# Patient Record
Sex: Female | Born: 1955 | Race: Black or African American | Hispanic: No | Smoking: Never smoker
Health system: Southern US, Community
[De-identification: ages and names within clinical notes are randomized; demographics above are authoritative.]

## PROBLEM LIST (undated history)

## (undated) DIAGNOSIS — I10 Essential (primary) hypertension: Secondary | ICD-10-CM

## (undated) DIAGNOSIS — M1990 Unspecified osteoarthritis, unspecified site: Secondary | ICD-10-CM

## (undated) DIAGNOSIS — K219 Gastro-esophageal reflux disease without esophagitis: Secondary | ICD-10-CM

## (undated) DIAGNOSIS — I219 Acute myocardial infarction, unspecified: Secondary | ICD-10-CM

## (undated) HISTORY — PX: TUBAL LIGATION: SHX77

## (undated) HISTORY — DX: Acute myocardial infarction, unspecified: I21.9

## (undated) HISTORY — DX: Unspecified osteoarthritis, unspecified site: M19.90

---

## 2005-06-17 ENCOUNTER — Emergency Department (HOSPITAL_COMMUNITY): Admission: EM | Admit: 2005-06-17 | Discharge: 2005-06-17 | Payer: Self-pay | Admitting: Emergency Medicine

## 2008-10-23 ENCOUNTER — Inpatient Hospital Stay (HOSPITAL_COMMUNITY): Admission: EM | Admit: 2008-10-23 | Discharge: 2008-10-24 | Payer: Self-pay | Admitting: Emergency Medicine

## 2008-10-23 ENCOUNTER — Ambulatory Visit: Payer: Self-pay | Admitting: Internal Medicine

## 2011-04-20 NOTE — Discharge Summary (Signed)
NAMEANALYSSE, QUINONEZ NO.:  [id number]   MEDICAL RECORD NO.:  [id number]          PATIENT TYPE:  INP   LOCATION:  3707                         FACILITY:  [facility]   PHYSICIAN:  Alvester Morin, M.D.  DATE OF BIRTH:  08/10/1956   DATE OF ADMISSION:  10/23/2008  DATE OF DISCHARGE:  10/24/2008                               DISCHARGE SUMMARY   CONTINUITY DOCTOR:  Celene Kras, MD   DISCHARGE DIAGNOSES:  Chest pain and arm pain likely musculoskeletal  pain.   OTHER DIAGNOSES:  1. History of hypertension, not on medication.  2. Gastroesophageal reflux disease.   DISCHARGE MEDICATIONS:  1. Protonix 40 mg p.o. daily.  2. Tylenol 325 mg q.8 h. for pain as needed.   DISPOSITION AND FOLLOWUP:  The patient has an appointment with Dr.  Stevphen Rochester.  She will receive a call from Dr. Kerry Kass office for  appointment schedule, and at that appointment, resolution of her arm  pain and chest pain need to be followed up.  Also her blood pressure  need to be followed up to see if the patient will require any  antihypertensive medication.   PROCEDURES:  None.   CONSULTANTS:  None.   HISTORY OF PRESENT ILLNESS:  A 55 year old with past medical history  significant for hypertension, not on medications and GERD, who presents  to the Emergency Department complaining of chest pain.  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  She described her pain at 6/10, lasted 2 minutes, stabbing  in quality, not associated with dizziness, diaphoresis, nausea, and not  pleuritic in nature.  She also related left arm pain that has started 3  days prior to admission, same quality and stabbing in nature at 8/10 and  intermittent.  She denies any history of trauma.  The pain is not worse  by movement.  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  She also denies nausea, vomiting, diarrhea, or reflux symptoms.   VITAL SIGNS:   Temperature 97.9, blood pressure 166/83, pulse 99,  respirations 20, sat 100% on room air.  GENERAL:  The patient was lying in bed in not acute distress.  EYES:  PERRLA.  Extraocular muscles intact.  ENT:  No tonsillar enlargement.  No plaque.  No erythema.  NECK:  Supple.  No thyromegaly.  RESPIRATION:  Clear to auscultation.  No wheezes.  No rales.  No  rhonchi.  CARDIOVASCULAR:  S1 and S2, normal.  Regular rhythm and rate.  No  murmur.  SKIN:  No rashes.  NEUROLOGIC:  Nonfocal.   ADMISSION LABORATORY DATA:  Sodium 142, potassium 3.7, chloride 108,  bicarb 24, BUN 15, creatinine 0.8, glucose 113.  CBC; white blood cells  7.2, hemoglobin 15.9, hematocrit 41.0, platelet 287, ANC 4.2, MCV 86.7.  EKG sinus tachy, no ST-T wave abnormalities.  Chest x-ray, no acute  cardiopulmonary process.  Troponin, point-of-care marker 0.05.   HOSPITAL COURSE:  Problem #1:  Chest pain.  This was likely secondary to  musculoskeletal pain.  The patient was  admitted to the telemetry floor  to rule out acute coronary syndrome.  EKG, she had some T-wave inversion  in V1, V2, V3 and these remained the same on repeated EKG.  Cardiac  enzymes x3, negative.  Chest x-ray was negative for infiltrate or  widening of the mediastinum or pneumothorax.  UDS was negative.  TSH was  normal at 0.875.  Lipid profile with cholesterol at 127, HDL 34, LDL 73.  Hemoglobin A1c 5.7.  During the admission, the patient's pain resolved  after rest.   Problem #2:  Hypertension.  The patient was found to have a blood  pressure, during admission of 166/83.  She was started on metoprolol and  her blood pressure decreased to 110/57, 120/78.  We decided to discharge  her on no blood pressure medications.  She will need to have a follow  with her primary care physician and to measure her blood pressure again  to see if the patient require any antihypertensive medication.  For her  blood pressure, we will recommend a diuretic like  hydrochlorothiazide or  Lasix.   Problem #3:  GERD.  We will continue with Protonix during this  admission.   Ms. Rollo was discharged in good condition.  Her chest pain and arm  pain has resolved.  Vital signs, respirations 18, blood pressure 120/78,  sat 100% on room air.      Hartley Barefoot, MD  Electronically Signed      Alvester Morin, M.D.  Electronically Signed    BR/MEDQ  D:  10/24/2008  T:  10/25/2008  Job:  [id number]

## 2011-09-08 LAB — POCT I-STAT, CHEM 8
Calcium, Ion: 1.21
Chloride: 108
Creatinine, Ser: 0.8
Glucose, Bld: 113 — ABNORMAL HIGH
Potassium: 3.7
Sodium: 142

## 2011-09-08 LAB — CBC
Hemoglobin: 11.5 — ABNORMAL LOW
Hemoglobin: 13.5
MCHC: 33.8
MCHC: 34.5
MCV: 86.7
Platelets: 239
RDW: 13.5
WBC: 7.2

## 2011-09-08 LAB — BASIC METABOLIC PANEL
BUN: 8
Chloride: 111
Creatinine, Ser: 0.68
GFR calc non Af Amer: >60
Sodium: 141

## 2011-09-08 LAB — DIFFERENTIAL
Basophils Relative: 1
Eosinophils Absolute: 0
Eosinophils Relative: 0
Lymphocytes Relative: 33
Lymphs Abs: 2.4
Monocytes Absolute: 0.4
Neutrophils Relative %: 46
Neutrophils Relative %: 59

## 2011-09-08 LAB — COMPREHENSIVE METABOLIC PANEL
Albumin: 3.6
Alkaline Phosphatase: 75
CO2: 24
Calcium: 8.9
GFR calc Af Amer: >60
Total Bilirubin: 0.9

## 2011-09-08 LAB — PROTIME-INR: INR: 0.9

## 2011-09-08 LAB — CARDIAC PANEL(CRET KIN+CKTOT+MB+TROPI)
CK, MB: 0.8
Relative Index: INVALID
Total CK: 89
Total CK: 93
Troponin I: <0.01

## 2011-09-08 LAB — CK TOTAL AND CKMB (NOT AT ARMC): Relative Index: 0.8

## 2011-09-08 LAB — LIPID PANEL
Cholesterol: 143
HDL: 34 — ABNORMAL LOW
LDL Cholesterol: 73
LDL Cholesterol: 87
Total CHOL/HDL Ratio: 3.7
Triglycerides: 100
Triglycerides: 55

## 2011-09-08 LAB — RAPID URINE DRUG SCREEN, HOSP PERFORMED
Amphetamines: NOT DETECTED
Barbiturates: NOT DETECTED
Benzodiazepines: NOT DETECTED
Cocaine: NOT DETECTED
Opiates: NOT DETECTED
Tetrahydrocannabinol: NOT DETECTED

## 2011-09-08 LAB — APTT: aPTT: 31

## 2011-09-08 LAB — TSH: TSH: 0.875

## 2011-09-08 LAB — POCT CARDIAC MARKERS
CKMB, poc: <1 — ABNORMAL LOW
Troponin i, poc: <0.05

## 2011-09-08 LAB — TROPONIN I: Troponin I: <0.01

## 2011-09-08 LAB — HEMOGLOBIN A1C: Hgb A1c MFr Bld: 5.7

## 2013-04-10 ENCOUNTER — Encounter (HOSPITAL_COMMUNITY): Payer: Self-pay | Admitting: Emergency Medicine

## 2013-04-10 ENCOUNTER — Emergency Department (HOSPITAL_COMMUNITY): Payer: Self-pay

## 2013-04-10 ENCOUNTER — Emergency Department (HOSPITAL_COMMUNITY)
Admission: EM | Admit: 2013-04-10 | Discharge: 2013-04-10 | Disposition: A | Discharge status: Home / Self Care | Payer: Self-pay | Attending: Emergency Medicine | Admitting: Emergency Medicine

## 2013-04-10 DIAGNOSIS — R0789 Other chest pain: Secondary | ICD-10-CM | POA: Insufficient documentation

## 2013-04-10 DIAGNOSIS — R062 Wheezing: Secondary | ICD-10-CM | POA: Insufficient documentation

## 2013-04-10 DIAGNOSIS — Z7982 Long term (current) use of aspirin: Secondary | ICD-10-CM | POA: Insufficient documentation

## 2013-04-10 DIAGNOSIS — E049 Nontoxic goiter, unspecified: Secondary | ICD-10-CM | POA: Insufficient documentation

## 2013-04-10 DIAGNOSIS — E876 Hypokalemia: Secondary | ICD-10-CM

## 2013-04-10 DIAGNOSIS — J159 Unspecified bacterial pneumonia: Secondary | ICD-10-CM | POA: Insufficient documentation

## 2013-04-10 DIAGNOSIS — Z8719 Personal history of other diseases of the digestive system: Secondary | ICD-10-CM | POA: Insufficient documentation

## 2013-04-10 DIAGNOSIS — J189 Pneumonia, unspecified organism: Secondary | ICD-10-CM

## 2013-04-10 DIAGNOSIS — I10 Essential (primary) hypertension: Secondary | ICD-10-CM | POA: Insufficient documentation

## 2013-04-10 DIAGNOSIS — R071 Chest pain on breathing: Secondary | ICD-10-CM | POA: Insufficient documentation

## 2013-04-10 HISTORY — DX: Gastro-esophageal reflux disease without esophagitis: K21.9

## 2013-04-10 HISTORY — DX: Essential (primary) hypertension: I10

## 2013-04-10 LAB — BASIC METABOLIC PANEL
CO2: 27 mEq/L (ref 19–32)
Calcium: 9.3 mg/dL (ref 8.4–10.5)
Glucose, Bld: 150 mg/dL — ABNORMAL HIGH (ref 70–99)
Potassium: 3 mEq/L — ABNORMAL LOW (ref 3.5–5.1)
Sodium: 138 mEq/L (ref 135–145)

## 2013-04-10 LAB — CBC
HCT: 34 % — ABNORMAL LOW (ref 36.0–46.0)
Hemoglobin: 12.1 g/dL (ref 12.0–15.0)
MCH: 28.7 pg (ref 26.0–34.0)
MCV: 80.8 fL (ref 78.0–100.0)
Platelets: 474 10*3/uL — ABNORMAL HIGH (ref 150–400)
RDW: 13.1 % (ref 11.5–15.5)

## 2013-04-10 LAB — POCT I-STAT TROPONIN I

## 2013-04-10 MED ORDER — HYDROCOD POLST-CHLORPHEN POLST 10-8 MG/5ML PO LQCR: 5.0000 mL | Freq: Two times a day (BID) | ORAL | Status: DC | PRN

## 2013-04-10 MED ORDER — HYDROCODONE-ACETAMINOPHEN 5-325 MG PO TABS
1.0000 | ORAL_TABLET | Freq: Once | ORAL | Status: AC
  Administered 2013-04-10: 1 via ORAL
  Filled 2013-04-10: qty 1

## 2013-04-10 MED ORDER — POTASSIUM CHLORIDE CRYS ER 20 MEQ PO TBCR
40.0000 meq | EXTENDED_RELEASE_TABLET | Freq: Two times a day (BID) | ORAL | Status: DC
  Administered 2013-04-10: 40 meq via ORAL
  Filled 2013-04-10: qty 2

## 2013-04-10 MED ORDER — ASPIRIN 81 MG PO TBEC: 81.0000 mg | DELAYED_RELEASE_TABLET | Freq: Every day | ORAL | Status: AC

## 2013-04-10 MED ORDER — AZITHROMYCIN 250 MG PO TABS: ORAL_TABLET | ORAL | Status: DC

## 2013-04-10 NOTE — ED Notes (Signed)
Pt c/o cough x 2 weeks and CP in left axillary and chest area worse with cough starting today

## 2013-04-10 NOTE — ED Notes (Signed)
Pt alert and mentating appropriately upon d/c. Pt given d/c teaching and prescriptions. Pt verbalizes understanding and has no further questions upon d/c. Pt instructed not to drive. Family at bedside endorses pt will not be driving home and they will be taking pt home. NAD noted upon d/c.

## 2013-04-10 NOTE — ED Provider Notes (Signed)
History     CSN: [id number]  Arrival date & time 04/10/13  1648   None     Chief Complaint  Patient presents with  . Cough  . Chest Pain    HPI Eileen Friedman is a 57 year old woman with PMH HTN, GERD, CAD (no cath or carotid artery disease evaluation) who presents with cough x 2 weeks and left chest pain with cough that started today. Her cough was initially productive of green sputum but gradually became unproductive. She has taken Robitussin and Mucinex with no relief of her cough. Sometimes she has coughing spells. She had subjective fever and chills two weeks ago but none recently. She had non bloody, non bilious emesis x2 yesterday, her appetite is decreased.  Her chest pain was initially present on the right side but has now moved to her left side, just below her axilla. She describes the pain as constant, dull ache pain that is worse with arm movement.   No recent travel/immobilization, no history of DVT/PE.   She denies shortness of breath, headache, blurry vision, diaphoresis, abdominal pain, constipation, diarrhea, or dysuria.    Past Medical History  Diagnosis Date  . GERD (gastroesophageal reflux disease)   . HTN (hypertension)     History reviewed. No pertinent past surgical history.  History reviewed. No pertinent family history.  History  Substance Use Topics  . Smoking status: Never Smoker   . Smokeless tobacco: Not on file  . Alcohol Use: Yes     Comment: occ    OB History   Grav Para Term Preterm Abortions TAB SAB Ect Mult Living                  Review of Systems  Constitutional: Positive for appetite change. Negative for fever, chills, diaphoresis, activity change, fatigue and unexpected weight change.  HENT: Negative for congestion.   Respiratory: Positive for cough, chest tightness and wheezing. Negative for shortness of breath.   Cardiovascular: Positive for chest pain. Negative for palpitations and leg swelling.  Gastrointestinal: Negative for  abdominal pain.  Genitourinary: Negative for dysuria.  Musculoskeletal: Negative for back pain.  Skin: Negative for rash.  Neurological: Negative for dizziness, weakness, light-headedness and headaches.  Psychiatric/Behavioral: Negative for agitation.    Allergies  Review of patient's allergies indicates no known allergies.  Home Medications   Current Outpatient Rx  Name  Route  Sig  Dispense  Refill  . aspirin EC 81 MG tablet   Oral   Take 81 mg by mouth daily.           BP 176/83  Pulse 98  Temp(Src) 98.4 F (36.9 C) (Oral)  Resp 18  SpO2 100%  Physical Exam  Nursing note and vitals reviewed. Constitutional: She is oriented to person, place, and time.  Obese  HENT:  Head: Atraumatic.  Mild edema of nasal turbinates  Eyes: Conjunctivae are normal.  Neck: Thyromegaly present.  Cardiovascular: Regular rhythm.   No murmur heard. tachycardia  Pulmonary/Chest: Effort normal and breath sounds normal. No respiratory distress. She has no wheezes. She has no rales. She exhibits no tenderness.  Expiratory rhonchi on the right  Abdominal: Soft. Bowel sounds are normal. She exhibits no distension. There is no tenderness. There is no rebound and no guarding.  Musculoskeletal: Normal range of motion. She exhibits tenderness. She exhibits no edema.  Tenderness to palpation left chest under axilla, no rib deformity  Neurological: She is alert and oriented to person, place, and  time.  Skin: Skin is warm and dry. No rash noted. No erythema. No pallor.  Psychiatric: She has a normal mood and affect. Her behavior is normal.    ED Course  Procedures (including critical care time)  Labs Reviewed  CBC - Abnormal; Notable for the following:    HCT 34.0 (*)    Platelets 474 (*)    All other components within normal limits  BASIC METABOLIC PANEL - Abnormal; Notable for the following:    Potassium 3.0 (*)    Glucose, Bld 150 (*)    All other components within normal limits  POCT  I-STAT TROPONIN I   Dg Chest 2 View  04/10/2013  *RADIOLOGY REPORT*  Clinical Data: Cough  CHEST - 2 VIEW  Comparison: 10/24/2008  Findings: Mild cardiomegaly.  Vascular clips in the right upper abdomen.  Coarse bibasilar interstitial opacities,   more conspicuous than on previous exam.  No effusion.  Minimal spurring in the mid thoracic spine.  IMPRESSION:  1.  Mild cardiomegaly with some increase in bibasilar interstitial opacities since previous exam.   Original Report Authenticated By: D. Andria Rhein, MD      No diagnosis found.   Date: 04/10/2013  Rate: 101  Rhythm: normal sinus rhythm  QRS Axis: normal  Intervals: normal, QTc <460  ST/T Wave abnormalities: nonspecific T wave changes  Conduction Disutrbances:none  Narrative Interpretation: nonspecific T wave flatening  Old EKG Reviewed: unchanged    MDM  57 year old woman with PMH of GERD, HTN presenting with cough x2 weeks and left sided chest pain x 1 day. EKG with no changes, troponin x1 negative. TIMI score of 1, low risk for MI. Wells score of 1.5, low risk for PE, no SOB. CXR with coarse bibasilar interstitial opacities, mild cardiomegaly, no edema on physical exam. CURB65 of ZERO.  Chest pain: most likely MSK, Vicodin once.   Cough/Possible community acquired PNA: Tusionex PRN, Zpack.  HTN: Pt does not have a PCP, therefore will not prescribe HCTZ/ACEi due to possible loss to follow up. Pt will follow up with [facility] for BP recheck.   Hypokalemia: Likely 2/2 decreased appetite, and emesis x2 yesterday. Kdur once. Patient advised to return to ED if she has fever/chills, nausea/vomitting, or new or worsening symptoms.      Ky Barban, MD 04/10/13 [phone]

## 2014-12-30 IMAGING — CR DG CHEST 2V
2 series · 2 of 2 positions shown · non-contrast
Comparison: 10/24/2008

CLINICAL DATA: Cough

CHEST - 2 VIEW

[w chest pa]
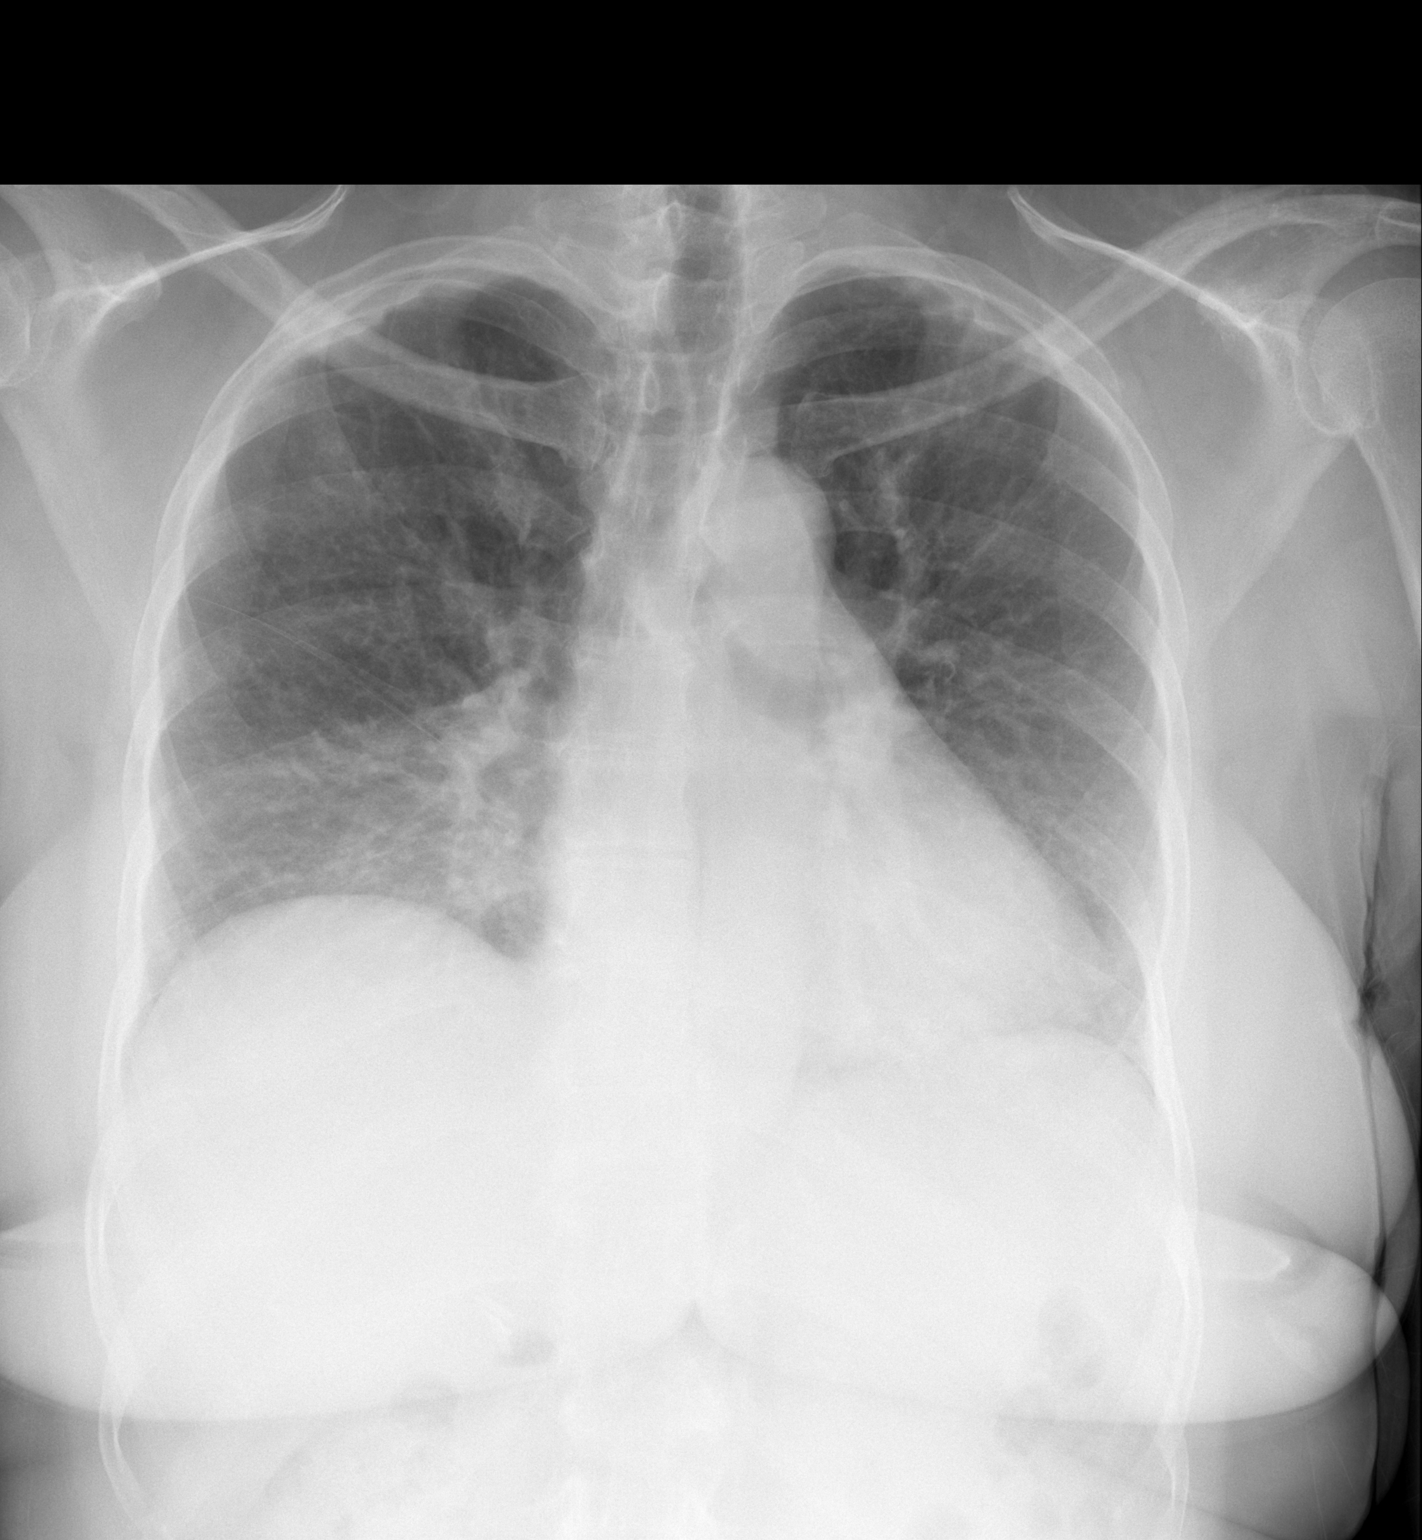

[w chest lat]
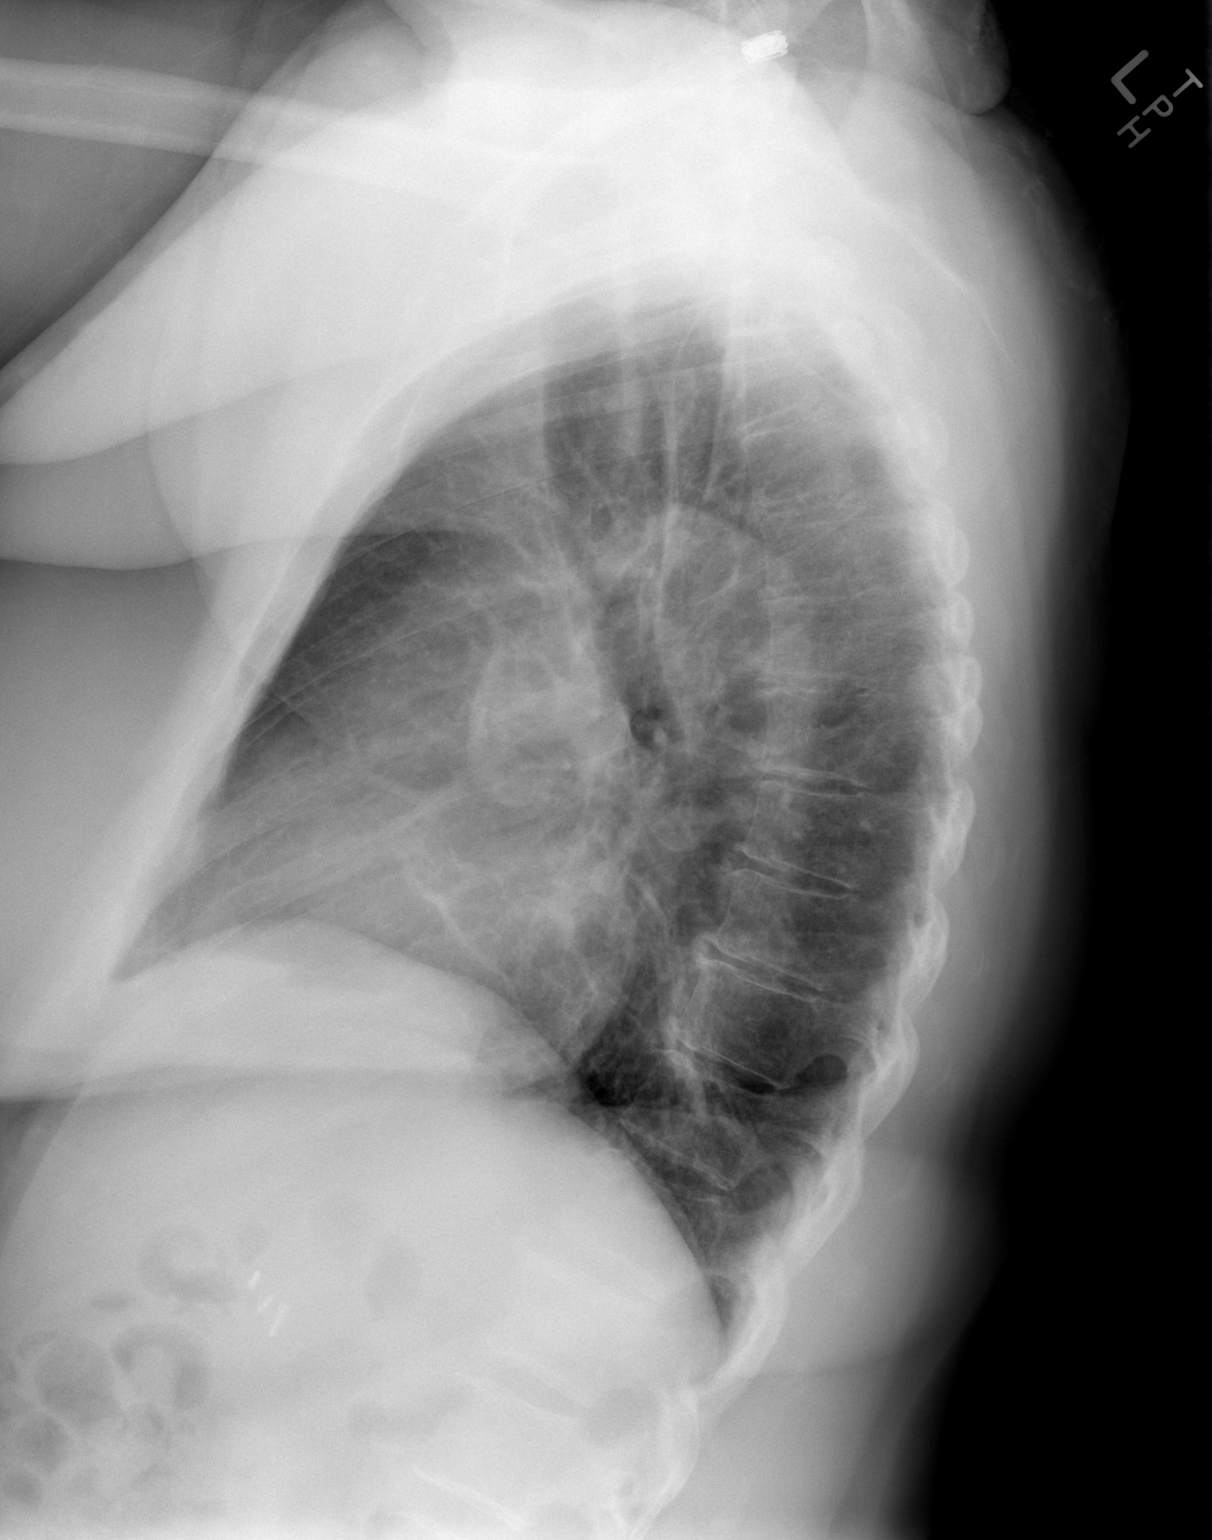

[2 of 2 positions shown; findings below may reference images not displayed]

FINDINGS: Mild cardiomegaly.  Vascular clips in the right upper
abdomen.  Coarse bibasilar interstitial opacities,   more
conspicuous than on previous exam.  No effusion.  Minimal spurring
in the mid thoracic spine.
IMPRESSION: 1.  Mild cardiomegaly with some increase in bibasilar interstitial
opacities since previous exam.

## 2017-02-02 ENCOUNTER — Ambulatory Visit (INDEPENDENT_AMBULATORY_CARE_PROVIDER_SITE_OTHER): Payer: BLUE CROSS/BLUE SHIELD | Admitting: Emergency Medicine

## 2017-02-02 VITALS — BP 160/90 | HR 93 | Temp 98.1°F | Ht 63.0 in | Wt 173.0 lb

## 2017-02-02 DIAGNOSIS — Z8679 Personal history of other diseases of the circulatory system: Secondary | ICD-10-CM

## 2017-02-02 DIAGNOSIS — Z23 Encounter for immunization: Secondary | ICD-10-CM

## 2017-02-02 DIAGNOSIS — Z111 Encounter for screening for respiratory tuberculosis: Secondary | ICD-10-CM

## 2017-02-02 DIAGNOSIS — R030 Elevated blood-pressure reading, without diagnosis of hypertension: Secondary | ICD-10-CM

## 2017-02-02 MED ORDER — TUBERCULIN PPD 5 UNIT/0.1ML ID SOLN
5.0000 [IU] | Freq: Once | INTRADERMAL | Status: AC
  Administered 2017-02-02: 5 [IU] via INTRADERMAL

## 2017-02-02 NOTE — Progress Notes (Signed)
Error

## 2017-02-02 NOTE — Patient Instructions (Addendum)
We recommend that you schedule a mammogram for breast cancer screening. Typically, you do not need a referral to do this. Please contact a local imaging center to schedule your mammogram.  [facility] - [phone]  *ask for the Radiology Department [facility]) - [phone] or [phone]  [facility] - [phone] [facility] - [phone] [facility] - [phone]  *ask for the Radiology Department [facility] - [phone]  *ask for the Radiology Department [facility] - [phone]  *ask for the Mammography Department [facility] - [phone]    IF you received an x-ray today, you will receive an invoice from [facility] Radiology. Please contact [facility] Radiology at [phone] with questions or concerns regarding your invoice.   IF you received labwork today, you will receive an invoice from [place]. Please contact [facility] at [phone] with questions or concerns regarding your invoice.   Our billing staff will not be able to assist you with questions regarding bills from these companies.  You will be contacted with the lab results as soon as they are available. The fastest way to get your results is to activate your My Chart account. Instructions are located on the last page of this paperwork. If you have not heard from [place] regarding the results in 2 weeks, please contact this office.     Hypertension Hypertension is another name for high blood pressure. High blood pressure forces your heart to work harder to pump blood. This can cause problems over time. There are two numbers in a blood pressure reading. There is a top number (systolic) over a bottom number (diastolic). It is best to have a blood pressure below 120/80. Healthy choices can help lower your blood pressure. You may need medicine to help lower your blood pressure if:  Your blood pressure  cannot be lowered with healthy choices.  Your blood pressure is higher than 130/80. Follow these instructions at home: Eating and drinking   If directed, follow the DASH eating plan. This diet includes:  Filling half of your plate at each meal with fruits and vegetables.  Filling one quarter of your plate at each meal with whole grains. Whole grains include whole wheat pasta, brown rice, and whole grain bread.  Eating or drinking low-fat dairy products, such as skim milk or low-fat yogurt.  Filling one quarter of your plate at each meal with low-fat (lean) proteins. Low-fat proteins include fish, skinless chicken, eggs, beans, and tofu.  Avoiding fatty meat, cured and processed meat, or chicken with skin.  Avoiding premade or processed food.  Eat less than 1,500 mg of salt (sodium) a day.  Limit alcohol use to no more than 1 drink a day for nonpregnant women and 2 drinks a day for men. One drink equals 12 oz of beer, 5 oz of wine, or 1 oz of hard liquor. Lifestyle   Work with your doctor to stay at a healthy weight or to lose weight. Ask your doctor what the best weight is for you.  Get at least 30 minutes of exercise that causes your heart to beat faster (aerobic exercise) most days of the week. This may include walking, swimming, or biking.  Get at least 30 minutes of exercise that strengthens your muscles (resistance exercise) at least 3 days a week. This may include lifting weights or pilates.  Do not use any products that contain nicotine or tobacco. This includes cigarettes and  e-cigarettes. If you need help quitting, ask your doctor.  Check your blood pressure at home as told by your doctor.  Keep all follow-up visits as told by your doctor. This is important. Medicines   Take over-the-counter and prescription medicines only as told by your doctor. Follow directions carefully.  Do not skip doses of blood pressure medicine. The medicine does not work as well if you skip  doses. Skipping doses also puts you at risk for problems.  Ask your doctor about side effects or reactions to medicines that you should watch for. Contact a doctor if:  You think you are having a reaction to the medicine you are taking.  You have headaches that keep coming back (recurring).  You feel dizzy.  You have swelling in your ankles.  You have trouble with your vision. Get help right away if:  You get a very bad headache.  You start to feel confused.  You feel weak or numb.  You feel faint.  You get very bad pain in your:  Chest.  Belly (abdomen).  You throw up (vomit) more than once.  You have trouble breathing. Summary  Hypertension is another name for high blood pressure.  Making healthy choices can help lower blood pressure. If your blood pressure cannot be controlled with healthy choices, you may need to take medicine. This information is not intended to replace advice given to you by your health care provider. Make sure you discuss any questions you have with your health care provider. Document Released: 05/10/2008 Document Revised: 10/20/2016 Document Reviewed: 10/20/2016 Elsevier Interactive Patient Education  2017 [employer]

## 2017-02-02 NOTE — Progress Notes (Signed)
Eileen Friedman 61 y.o.   Chief Complaint  Patient presents with  . PPD Reading    HISTORY OF PRESENT ILLNESS: This is a 61 y.o. female here for TB skin test.  HPI   Prior to Admission medications   Medication Sig Start Date End Date Taking? Authorizing Provider  aspirin 81 MG EC tablet Take 1 tablet (81 mg total) by mouth daily. Swallow whole. 04/10/13  Yes Blain Pais, MD  aspirin EC 81 MG tablet Take 81 mg by mouth daily.   Yes Historical Provider, MD  azithromycin (ZITHROMAX Z-PAK) 250 MG tablet Take two tablets the first day (500mg ) then one tablet per day until you finish the pack. Patient not taking: Reported on 02/02/2017 04/10/13   Blain Pais, MD  chlorpheniramine-HYDROcodone [facility] PENNKINETIC ER) 10-8 MG/5ML LQCR Take 5 mLs by mouth every 12 (twelve) hours as needed. Patient not taking: Reported on 02/02/2017 04/10/13   Blain Pais, MD    No Known Allergies  There are no active problems to display for this patient.   Past Medical History:  Diagnosis Date  . GERD (gastroesophageal reflux disease)   . HTN (hypertension)     History reviewed. No pertinent surgical history.  Social History   Social History  . Marital status: Divorced    Spouse name: N/A  . Number of children: N/A  . Years of education: N/A   Occupational History  . Not on file.   Social History Main Topics  . Smoking status: Never Smoker  . Smokeless tobacco: Never Used  . Alcohol use Yes     Comment: occ  . Drug use: No  . Sexual activity: Not on file   Other Topics Concern  . Not on file   Social History Narrative  . No narrative on file    History reviewed. No pertinent family history.   Review of Systems  All other systems reviewed and are negative.  Vitals:   02/02/17 1710  BP: (!) 160/90  Pulse: 93  Temp: 98.1 F (36.7 C)     Physical Exam  Constitutional: She is oriented to person, place, and time. She appears well-developed and  well-nourished.  HENT:  Head: Normocephalic and atraumatic.  Eyes: EOM are normal. Pupils are equal, round, and reactive to light.  Neck: Normal range of motion. Neck supple.  Cardiovascular: Normal rate.   Pulmonary/Chest: Effort normal.  Musculoskeletal: Normal range of motion.  Neurological: She is alert and oriented to person, place, and time.  Skin: Skin is warm and dry. Capillary refill takes less than 2 seconds.  Psychiatric: She has a normal mood and affect. Her behavior is normal.  Vitals reviewed.    ASSESSMENT & PLAN: Izabelle was seen today for ppd reading.  Diagnoses and all orders for this visit:  Encounter for PPD test  Need for tuberculosis vaccination -     tuberculin injection 5 Units; Inject 0.1 mLs (5 Units total) into the skin once.  H/O: hypertension  Elevated blood pressure reading    Patient Instructions   We recommend that you schedule a mammogram for breast cancer screening. Typically, you do not need a referral to do this. Please contact a local imaging center to schedule your mammogram.  [facility] - [phone]  *ask for the Radiology Department [facility] - [phone] or [phone]  [facility] - [phone] [place] [phone] [facility] - [phone]  *ask for  the Radiology Department [facility] - [phone]  *ask for the Radiology Department [facility] - [phone]  *ask for the [place] - [phone]    IF you received an x-ray today, you will receive an invoice from [facility] Radiology. Please contact [facility] Radiology at [phone] with questions or concerns regarding your invoice.   IF you received labwork today, you will receive an invoice from [place]. Please contact [facility] at [phone] with questions or concerns regarding your invoice.   Our billing  staff will not be able to assist you with questions regarding bills from these companies.  You will be contacted with the lab results as soon as they are available. The fastest way to get your results is to activate your My Chart account. Instructions are located on the last page of this paperwork. If you have not heard from [place] regarding the results in 2 weeks, please contact this office.     Hypertension Hypertension is another name for high blood pressure. High blood pressure forces your heart to work harder to pump blood. This can cause problems over time. There are two numbers in a blood pressure reading. There is a top number (systolic) over a bottom number (diastolic). It is best to have a blood pressure below 120/80. Healthy choices can help lower your blood pressure. You may need medicine to help lower your blood pressure if:  Your blood pressure cannot be lowered with healthy choices.  Your blood pressure is higher than 130/80. Follow these instructions at home: Eating and drinking   If directed, follow the DASH eating plan. This diet includes:  Filling half of your plate at each meal with fruits and vegetables.  Filling one quarter of your plate at each meal with whole grains. Whole grains include whole wheat pasta, brown rice, and whole grain bread.  Eating or drinking low-fat dairy products, such as skim milk or low-fat yogurt.  Filling one quarter of your plate at each meal with low-fat (lean) proteins. Low-fat proteins include fish, skinless chicken, eggs, beans, and tofu.  Avoiding fatty meat, cured and processed meat, or chicken with skin.  Avoiding premade or processed food.  Eat less than 1,500 mg of salt (sodium) a day.  Limit alcohol use to no more than 1 drink a day for nonpregnant women and 2 drinks a day for men. One drink equals 12 oz of beer, 5 oz of wine, or 1 oz of hard liquor. Lifestyle   Work with your doctor to stay at a healthy weight or to lose  weight. Ask your doctor what the best weight is for you.  Get at least 30 minutes of exercise that causes your heart to beat faster (aerobic exercise) most days of the week. This may include walking, swimming, or biking.  Get at least 30 minutes of exercise that strengthens your muscles (resistance exercise) at least 3 days a week. This may include lifting weights or pilates.  Do not use any products that contain nicotine or tobacco. This includes cigarettes and e-cigarettes. If you need help quitting, ask your doctor.  Check your blood pressure at home as told by your doctor.  Keep all follow-up visits as told by your doctor. This is important. Medicines   Take over-the-counter and prescription medicines only as told by your doctor. Follow directions carefully.  Do not skip doses of blood pressure medicine. The medicine does not work as well if you skip doses. Skipping doses also  puts you at risk for problems.  Ask your doctor about side effects or reactions to medicines that you should watch for. Contact a doctor if:  You think you are having a reaction to the medicine you are taking.  You have headaches that keep coming back (recurring).  You feel dizzy.  You have swelling in your ankles.  You have trouble with your vision. Get help right away if:  You get a very bad headache.  You start to feel confused.  You feel weak or numb.  You feel faint.  You get very bad pain in your:  Chest.  Belly (abdomen).  You throw up (vomit) more than once.  You have trouble breathing. Summary  Hypertension is another name for high blood pressure.  Making healthy choices can help lower blood pressure. If your blood pressure cannot be controlled with healthy choices, you may need to take medicine. This information is not intended to replace advice given to you by your health care provider. Make sure you discuss any questions you have with your health care provider. Document  Released: 05/10/2008 Document Revised: 10/20/2016 Document Reviewed: 10/20/2016 Elsevier Interactive Patient Education  2017 [employer]vier Inc.       Agustina Caroli, MD [facility]

## 2017-02-04 ENCOUNTER — Ambulatory Visit: Payer: BLUE CROSS/BLUE SHIELD

## 2017-02-04 DIAGNOSIS — Z111 Encounter for screening for respiratory tuberculosis: Secondary | ICD-10-CM

## 2017-02-04 LAB — TB SKIN TEST
Induration: 0 mm
TB Skin Test: NEGATIVE

## 2017-04-11 ENCOUNTER — Ambulatory Visit (INDEPENDENT_AMBULATORY_CARE_PROVIDER_SITE_OTHER): Payer: BLUE CROSS/BLUE SHIELD | Admitting: Family Medicine

## 2017-04-11 ENCOUNTER — Encounter: Payer: Self-pay | Admitting: Family Medicine

## 2017-04-11 VITALS — BP 160/82 | HR 95 | Temp 98.4°F | Resp 17 | Ht 63.5 in | Wt 172.0 lb

## 2017-04-11 DIAGNOSIS — Z0000 Encounter for general adult medical examination without abnormal findings: Secondary | ICD-10-CM | POA: Diagnosis not present

## 2017-04-11 DIAGNOSIS — Z1159 Encounter for screening for other viral diseases: Secondary | ICD-10-CM

## 2017-04-11 DIAGNOSIS — Z114 Encounter for screening for human immunodeficiency virus [HIV]: Secondary | ICD-10-CM

## 2017-04-11 DIAGNOSIS — Z13220 Encounter for screening for lipoid disorders: Secondary | ICD-10-CM

## 2017-04-11 DIAGNOSIS — Z23 Encounter for immunization: Secondary | ICD-10-CM

## 2017-04-11 DIAGNOSIS — Z131 Encounter for screening for diabetes mellitus: Secondary | ICD-10-CM

## 2017-04-11 DIAGNOSIS — I10 Essential (primary) hypertension: Secondary | ICD-10-CM

## 2017-04-11 DIAGNOSIS — Z1211 Encounter for screening for malignant neoplasm of colon: Secondary | ICD-10-CM

## 2017-04-11 MED ORDER — AMLODIPINE BESYLATE 5 MG PO TABS: 5.0000 mg | ORAL_TABLET | Freq: Every day | ORAL | 1 refills | Status: AC

## 2017-04-11 NOTE — Progress Notes (Signed)
By signing my name below, I, Mesha Guinyard, attest that this documentation has been prepared under the direction and in the presence of Meredith Staggers, MD.  Electronically Signed: Arvilla Market, Medical Scribe. 04/11/17. 11:04 AM.  Subjective:    Patient ID: Eileen Friedman, female    DOB: 09-09-1956, 61 y.o.   MRN: [id number]  HPI Chief Complaint  Patient presents with  . Annual Exam    HPI Comments: Eileen Friedman is a 61 y.o. female who presents to Primary Care at [facility] for her annual physical. Pt doesn't have a PCP. Pt is fasting.   High BP: Reports her bp was high the last time she was here because she received a phone call stating her mom was in the hospital right before checking it. Denies chest pain, chest tightness, SOB, light-headedness, or dizziness.  BP Readings from Last 3 Encounters:  04/11/17 (!) 178/102  02/02/17 (!) 160/90  04/10/13 152/79   Cancer Screening: Breast: Pt hasn't had a recent mammogram. FHx: Older sister died of breast CA. Cervical: Followed by OB/GYN. Pt had a pap smear a couple of months ago with a breast exam. Nl results for both exam without nodules/lumps/bumps found Colon: Pt has never had a colonoscopy but would like a referral.  Immunizations: Pt doesn't remember when she received her last tdap. Immunization History  Administered Date(s) Administered  . PPD Test 02/02/2017   Vision: Pt has not seen an ophthalmologist recently, but knows who she could go to.  Visual Acuity Screening   Right eye Left eye Both eyes  Without correction: 20/40 20/70 20/30   With correction:      Dentist: Pt is not followed by a dentist, but knows who she could go to.  Exercise: Pt does not exercise, but she does a lot of walking.  Hep C Screening: Pt has never had Hep C screening but agrees to screening.  Depression Screening: Depression screen [facility] 2/9 04/11/2017 02/02/2017  Decreased Interest 0 0  Down, Depressed, Hopeless 0 0  PHQ - 2  Score 0 0   There are no active problems to display for this patient.  Past Medical History:  Diagnosis Date  . Arthritis   . GERD (gastroesophageal reflux disease)   . HTN (hypertension)   . Myocardial infarction [facility])    Past Surgical History:  Procedure Laterality Date  . TUBAL LIGATION     No Known Allergies Prior to Admission medications   Medication Sig Start Date End Date Taking? Authorizing Provider  aspirin 81 MG EC tablet Take 1 tablet (81 mg total) by mouth daily. Swallow whole. 04/10/13   Ky Barban, MD  aspirin EC 81 MG tablet Take 81 mg by mouth daily.    [provider]  azithromycin (ZITHROMAX Z-PAK) 250 MG tablet Take two tablets the first day (500mg ) then one tablet per day until you finish the pack. Patient not taking: Reported on 02/02/2017 04/10/13   Ky Barban, MD  chlorpheniramine-HYDROcodone [facility] PENNKINETIC ER) 10-8 MG/5ML [facility] Take 5 mLs by mouth every 12 (twelve) hours as needed. Patient not taking: Reported on 02/02/2017 04/10/13   Ky Barban, MD   Social History   Social History  . Marital status: Divorced    Spouse name: N/A  . Number of children: N/A  . Years of education: N/A   Occupational History  . Not on file.   Social History Main Topics  . Smoking status: Never Smoker  . Smokeless tobacco: Never Used  .  Alcohol use Yes     Comment: occ  . Drug use: No  . Sexual activity: Not on file   Other Topics Concern  . Not on file   Social History Narrative  . No narrative on file   Review of Systems  13 point ROS was negative. Objective:  Physical Exam  Constitutional: She is oriented to person, place, and time. She appears well-developed and well-nourished.  HENT:  Head: Normocephalic and atraumatic.  Right Ear: External ear normal.  Left Ear: External ear normal.  Mouth/Throat: Oropharynx is clear and moist.  Eyes: Conjunctivae are normal. Pupils are equal, round, and reactive to light.    Neck: Normal range of motion. Neck supple. No thyromegaly present.  Cardiovascular: Normal rate, regular rhythm, normal heart sounds and intact distal pulses.   No murmur heard. Pulmonary/Chest: Effort normal and breath sounds normal. No respiratory distress. She has no wheezes.  Abdominal: Soft. Bowel sounds are normal. There is no tenderness.  Musculoskeletal: Normal range of motion. She exhibits no edema or tenderness.  Lymphadenopathy:    She has no cervical adenopathy.  Neurological: She is alert and oriented to person, place, and time.  Skin: Skin is warm and dry. No rash noted.  Psychiatric: She has a normal mood and affect. Her behavior is normal. Thought content normal.  Vitals reviewed.    Vitals:   04/11/17 1037 04/11/17 1151  BP: (!) 178/102 (!) 160/82  Pulse: 95   Resp: 17   Temp: 98.4 F (36.9 C)   TempSrc: Oral   SpO2: 100%   Weight: 172 lb (78 kg)   Height: 5' 3.5" (1.613 m)    Body mass index is 29.99 kg/m. Assessment & Plan:   Eileen Friedman is a 61 y.o. female Annual physical exam  - -anticipatory guidance as below in AVS, screening labs above. Health maintenance items as above in HPI discussed/recommended as applicable.   Screen for colon cancer - Plan: Ambulatory referral to Gastroenterology  Need for Tdap vaccination - Plan: Tdap vaccine greater than or equal to 7yo IM  Need for hepatitis C screening test - Plan: Hepatitis C antibody  Screening for HIV (human immunodeficiency virus) - Plan: HIV antibody  Screening for diabetes mellitus  - check CMP.   Screening for hyperlipidemia - Plan: Comprehensive metabolic panel, Lipid panel  Hypertension, essential - Plan: Comprehensive metabolic panel, amLODipine (NORVASC) 5 MG tablet  - start norvasc 5mg  qd, recheck 2-4 weeks. Labs pending.   Meds ordered this encounter  Medications  . esomeprazole (NEXIUM) 20 MG capsule    Sig: Take 20 mg by mouth daily at 12 noon.  Marland Kitchen amLODipine (NORVASC) 5 MG  tablet    Sig: Take 1 tablet (5 mg total) by mouth daily.    Dispense:  30 tablet    Refill:  1   Patient Instructions   I will refer you for colonoscopy, but please call as soon as possible to have mammogram performed to screen for breast cancer (especially since you have had a family member with breast cancer).   Please schedule appointment with eye care provider.  Let me know if you need names for eye care provider or dentist.   Start blood pressure medicine once per day and recheck in next 2-4 weeks. Keep a record of your blood pressures outside of the office and bring them to the next office visit.   Keeping You Healthy  Get These Tests  Blood Pressure- Have your blood pressure checked by  your healthcare provider at least once a year.  Normal blood pressure is 120/80.  Weight- Have your body mass index (BMI) calculated to screen for obesity.  BMI is a measure of body fat based on height and weight.  You can calculate your own BMI at https://www.west-esparza.com/  Cholesterol- Have your cholesterol checked every year.  Diabetes- Have your blood sugar checked every year if you have high blood pressure, high cholesterol, a family history of diabetes or if you are overweight.  Pap Test - Have a pap test every 1 to 5 years if you have been sexually active.  If you are older than 65 and recent pap tests have been normal you may not need additional pap tests.  In addition, if you have had a hysterectomy  for benign disease additional pap tests are not necessary.  Mammogram-Yearly mammograms are essential for early detection of breast cancer  Screening for Colon Cancer- Colonoscopy starting at age 93. Screening may begin sooner depending on your family history and other health conditions.  Follow up colonoscopy as directed by your Gastroenterologist.  Screening for Osteoporosis- Screening begins at age 102 with bone density scanning, sooner if you are at higher risk for developing  Osteoporosis.  Get these medicines  Calcium with Vitamin D- Your body requires 1200-1500 mg of Calcium a day and [phone] IU of Vitamin D a day.  You can only absorb 500 mg of Calcium at a time therefore Calcium must be taken in 2 or 3 separate doses throughout the day.  Hormones- Hormone therapy has been associated with increased risk for certain cancers and heart disease.  Talk to your healthcare provider about if you need relief from menopausal symptoms.  Aspirin- Ask your healthcare provider about taking Aspirin to prevent Heart Disease and Stroke.  Get these Immuniztions  Flu shot- Every fall  Pneumonia shot- Once after the age of 39; if you are younger ask your healthcare provider if you need a pneumonia shot.  Tetanus- Every ten years.  Zostavax- Once after the age of 66 to prevent shingles.  Take these steps  Don't smoke- Your healthcare provider can help you quit. For tips on how to quit, ask your healthcare provider or go to www.smokefree.gov or call 1-800 QUIT-NOW.  Be physically active- Exercise 5 days a week for a minimum of 30 minutes.  If you are not already physically active, start slow and gradually work up to 30 minutes of moderate physical activity.  Try walking, dancing, bike riding, swimming, etc.  Eat a healthy diet- Eat a variety of healthy foods such as fruits, vegetables, whole grains, low fat milk, low fat cheeses, yogurt, lean meats, chicken, fish, eggs, dried beans, tofu, etc.  For more information go to www.thenutritionsource.org  Dental visit- Brush and floss teeth twice daily; visit your dentist twice a year.  Eye exam- Visit your Optometrist or Ophthalmologist yearly.  Drink alcohol in moderation- Limit alcohol intake to one drink or less a day.  Never drink and drive.  Depression- Your emotional health is as important as your physical health.  If you're feeling down or losing interest in things you normally enjoy, please talk to your healthcare  provider.  Seat Belts- can save your life; always wear one  Smoke/Carbon Monoxide detectors- These detectors need to be installed on the appropriate level of your home.  Replace batteries at least once a year.  Violence- If anyone is threatening or hurting you, please tell your healthcare provider.  Living Will/ Health  care power of attorney- Discuss with your healthcare provider and family.    Hypertension Hypertension, commonly called high blood pressure, is when the force of blood pumping through the arteries is too strong. The arteries are the blood vessels that carry blood from the heart throughout the body. Hypertension forces the heart to work harder to pump blood and may cause arteries to become narrow or stiff. Having untreated or uncontrolled hypertension can cause heart attacks, strokes, kidney disease, and other problems. A blood pressure reading consists of a higher number over a lower number. Ideally, your blood pressure should be below 120/80. The first ("top") number is called the systolic pressure. It is a measure of the pressure in your arteries as your heart beats. The second ("bottom") number is called the diastolic pressure. It is a measure of the pressure in your arteries as the heart relaxes. What are the causes? The cause of this condition is not known. What increases the risk? Some risk factors for high blood pressure are under your control. Others are not. Factors you can change   Smoking.  Having type 2 diabetes mellitus, high cholesterol, or both.  Not getting enough exercise or physical activity.  Being overweight.  Having too much fat, sugar, calories, or salt (sodium) in your diet.  Drinking too much alcohol. Factors that are difficult or impossible to change   Having chronic kidney disease.  Having a family history of high blood pressure.  Age. Risk increases with age.  Race. You may be at higher risk if you are African-American.  Gender. Men  are at higher risk than women before age 64. After age 26, women are at higher risk than men.  Having obstructive sleep apnea.  Stress. What are the signs or symptoms? Extremely high blood pressure (hypertensive crisis) may cause:  Headache.  Anxiety.  Shortness of breath.  Nosebleed.  Nausea and vomiting.  Severe chest pain.  Jerky movements you cannot control (seizures). How is this diagnosed? This condition is diagnosed by measuring your blood pressure while you are seated, with your arm resting on a surface. The cuff of the blood pressure monitor will be placed directly against the skin of your upper arm at the level of your heart. It should be measured at least twice using the same arm. Certain conditions can cause a difference in blood pressure between your right and left arms. Certain factors can cause blood pressure readings to be lower or higher than normal (elevated) for a short period of time:  When your blood pressure is higher when you are in a health care provider's office than when you are at home, this is called white coat hypertension. Most people with this condition do not need medicines.  When your blood pressure is higher at home than when you are in a health care provider's office, this is called masked hypertension. Most people with this condition may need medicines to control blood pressure. If you have a high blood pressure reading during one visit or you have normal blood pressure with other risk factors:  You may be asked to return on a different day to have your blood pressure checked again.  You may be asked to monitor your blood pressure at home for 1 week or longer. If you are diagnosed with hypertension, you may have other blood or imaging tests to help your health care provider understand your overall risk for other conditions. How is this treated? This condition is treated by making healthy lifestyle changes,  such as eating healthy foods, exercising  more, and reducing your alcohol intake. Your health care provider may prescribe medicine if lifestyle changes are not enough to get your blood pressure under control, and if:  Your systolic blood pressure is above 130.  Your diastolic blood pressure is above 80. Your personal target blood pressure may vary depending on your medical conditions, your age, and other factors. Follow these instructions at home: Eating and drinking   Eat a diet that is high in fiber and potassium, and low in sodium, added sugar, and fat. An example eating plan is called the DASH (Dietary Approaches to Stop Hypertension) diet. To eat this way:  Eat plenty of fresh fruits and vegetables. Try to fill half of your plate at each meal with fruits and vegetables.  Eat whole grains, such as whole wheat pasta, brown rice, or whole grain bread. Fill about one quarter of your plate with whole grains.  Eat or drink low-fat dairy products, such as skim milk or low-fat yogurt.  Avoid fatty cuts of meat, processed or cured meats, and poultry with skin. Fill about one quarter of your plate with lean proteins, such as fish, chicken without skin, beans, eggs, and tofu.  Avoid premade and processed foods. These tend to be higher in sodium, added sugar, and fat.  Reduce your daily sodium intake. Most people with hypertension should eat less than 1,500 mg of sodium a day.  Limit alcohol intake to no more than 1 drink a day for nonpregnant women and 2 drinks a day for men. One drink equals 12 oz of beer, 5 oz of wine, or 1 oz of hard liquor. Lifestyle   Work with your health care provider to maintain a healthy body weight or to lose weight. Ask what an ideal weight is for you.  Get at least 30 minutes of exercise that causes your heart to beat faster (aerobic exercise) most days of the week. Activities may include walking, swimming, or biking.  Include exercise to strengthen your muscles (resistance exercise), such as pilates or  lifting weights, as part of your weekly exercise routine. Try to do these types of exercises for 30 minutes at least 3 days a week.  Do not use any products that contain nicotine or tobacco, such as cigarettes and e-cigarettes. If you need help quitting, ask your health care provider.  Monitor your blood pressure at home as told by your health care provider.  Keep all follow-up visits as told by your health care provider. This is important. Medicines   Take over-the-counter and prescription medicines only as told by your health care provider. Follow directions carefully. Blood pressure medicines must be taken as prescribed.  Do not skip doses of blood pressure medicine. Doing this puts you at risk for problems and can make the medicine less effective.  Ask your health care provider about side effects or reactions to medicines that you should watch for. Contact a health care provider if:  You think you are having a reaction to a medicine you are taking.  You have headaches that keep coming back (recurring).  You feel dizzy.  You have swelling in your ankles.  You have trouble with your vision. Get help right away if:  You develop a severe headache or confusion.  You have unusual weakness or numbness.  You feel faint.  You have severe pain in your chest or abdomen.  You vomit repeatedly.  You have trouble breathing. Summary  Hypertension is when the  force of blood pumping through your arteries is too strong. If this condition is not controlled, it may put you at risk for serious complications.  Your personal target blood pressure may vary depending on your medical conditions, your age, and other factors. For most people, a normal blood pressure is less than 120/80.  Hypertension is treated with lifestyle changes, medicines, or a combination of both. Lifestyle changes include weight loss, eating a healthy, low-sodium diet, exercising more, and limiting alcohol. This  information is not intended to replace advice given to you by your health care provider. Make sure you discuss any questions you have with your health care provider. Document Released: 11/22/2005 Document Revised: 10/20/2016 Document Reviewed: 10/20/2016 Elsevier Interactive Patient Education  2017 [employer]   IF you received an x-ray today, you will receive an invoice from [facility]. Please contact [facility] at [phone] with questions or concerns regarding your invoice.   IF you received labwork today, you will receive an invoice from [facility]. Please contact [facility] at [phone] with questions or concerns regarding your invoice.   Our billing staff will not be able to assist you with questions regarding bills from these companies.  You will be contacted with the lab results as soon as they are available. The fastest way to get your results is to activate your My Chart account. Instructions are located on the last page of this paperwork. If you have not heard from us regarding the results in 2 weeks, please contact this office.    We recommend that you schedule a mammogram for breast cancer screening. Typically, you do not need a referral to do this. Please contact a local imaging center to schedule your mammogram.  [facility] - [phone]  *ask for the Radiology Department [facility] - [phone](336) [phone] or [phone]) [phone]  [facility] - [phone] [facility] - [phone](336) [phone] [facility] - [phone]) [phone]  *ask for the Radiology Department [facility] - [phone](336) [phone]  *ask for the Radiology Department [facility] - [phone]  *ask for the Mammography Department [facility] - [phone](336) [phone]  I personally performed the services described in this documentation, which was scribed in my presence. The recorded information has been reviewed and considered  for accuracy and completeness, addended by me as needed, and agree with information above.  Signed,   Meredith StaggersJeffrey Shequila Neglia, MD [facility] at [facility].  04/13/17 1:46 PM

## 2017-04-11 NOTE — Patient Instructions (Addendum)
I will refer you for colonoscopy, but please call as soon as possible to have mammogram performed to screen for breast cancer (especially since you have had a family member with breast cancer).   Please schedule appointment with eye care provider.  Let me know if you need names for eye care provider or dentist.   Start blood pressure medicine once per day and recheck in next 2-4 weeks. Keep a record of your blood pressures outside of the office and bring them to the next office visit.   Keeping You Healthy  Get These Tests  Blood Pressure- Have your blood pressure checked by your healthcare provider at least once a year.  Normal blood pressure is 120/80.  Weight- Have your body mass index (BMI) calculated to screen for obesity.  BMI is a measure of body fat based on height and weight.  You can calculate your own BMI at https://www.west-esparza.com/  Cholesterol- Have your cholesterol checked every year.  Diabetes- Have your blood sugar checked every year if you have high blood pressure, high cholesterol, a family history of diabetes or if you are overweight.  Pap Test - Have a pap test every 1 to 5 years if you have been sexually active.  If you are older than 65 and recent pap tests have been normal you may not need additional pap tests.  In addition, if you have had a hysterectomy  for benign disease additional pap tests are not necessary.  Mammogram-Yearly mammograms are essential for early detection of breast cancer  Screening for Colon Cancer- Colonoscopy starting at age 42. Screening may begin sooner depending on your family history and other health conditions.  Follow up colonoscopy as directed by your Gastroenterologist.  Screening for Osteoporosis- Screening begins at age 33 with bone density scanning, sooner if you are at higher risk for developing Osteoporosis.  Get these medicines  Calcium with Vitamin D- Your body requires 1200-1500 mg of Calcium a day and [phone] IU of Vitamin  D a day.  You can only absorb 500 mg of Calcium at a time therefore Calcium must be taken in 2 or 3 separate doses throughout the day.  Hormones- Hormone therapy has been associated with increased risk for certain cancers and heart disease.  Talk to your healthcare provider about if you need relief from menopausal symptoms.  Aspirin- Ask your healthcare provider about taking Aspirin to prevent Heart Disease and Stroke.  Get these Immuniztions  Flu shot- Every fall  Pneumonia shot- Once after the age of 17; if you are younger ask your healthcare provider if you need a pneumonia shot.  Tetanus- Every ten years.  Zostavax- Once after the age of 15 to prevent shingles.  Take these steps  Don't smoke- Your healthcare provider can help you quit. For tips on how to quit, ask your healthcare provider or go to www.smokefree.gov or call 1-800 QUIT-NOW.  Be physically active- Exercise 5 days a week for a minimum of 30 minutes.  If you are not already physically active, start slow and gradually work up to 30 minutes of moderate physical activity.  Try walking, dancing, bike riding, swimming, etc.  Eat a healthy diet- Eat a variety of healthy foods such as fruits, vegetables, whole grains, low fat milk, low fat cheeses, yogurt, lean meats, chicken, fish, eggs, dried beans, tofu, etc.  For more information go to www.thenutritionsource.org  Dental visit- Brush and floss teeth twice daily; visit your dentist twice a year.  Eye exam- Visit your Optometrist  or Ophthalmologist yearly.  Drink alcohol in moderation- Limit alcohol intake to one drink or less a day.  Never drink and drive.  Depression- Your emotional health is as important as your physical health.  If you're feeling down or losing interest in things you normally enjoy, please talk to your healthcare provider.  Seat Belts- can save your life; always wear one  Smoke/Carbon Monoxide detectors- These detectors need to be installed on the  appropriate level of your home.  Replace batteries at least once a year.  Violence- If anyone is threatening or hurting you, please tell your healthcare provider.  Living Will/ Health care power of attorney- Discuss with your healthcare provider and family.    Hypertension Hypertension, commonly called high blood pressure, is when the force of blood pumping through the arteries is too strong. The arteries are the blood vessels that carry blood from the heart throughout the body. Hypertension forces the heart to work harder to pump blood and may cause arteries to become narrow or stiff. Having untreated or uncontrolled hypertension can cause heart attacks, strokes, kidney disease, and other problems. A blood pressure reading consists of a higher number over a lower number. Ideally, your blood pressure should be below 120/80. The first ("top") number is called the systolic pressure. It is a measure of the pressure in your arteries as your heart beats. The second ("bottom") number is called the diastolic pressure. It is a measure of the pressure in your arteries as the heart relaxes. What are the causes? The cause of this condition is not known. What increases the risk? Some risk factors for high blood pressure are under your control. Others are not. Factors you can change   Smoking.  Having type 2 diabetes mellitus, high cholesterol, or both.  Not getting enough exercise or physical activity.  Being overweight.  Having too much fat, sugar, calories, or salt (sodium) in your diet.  Drinking too much alcohol. Factors that are difficult or impossible to change   Having chronic kidney disease.  Having a family history of high blood pressure.  Age. Risk increases with age.  Race. You may be at higher risk if you are African-American.  Gender. Men are at higher risk than women before age 63. After age 63, women are at higher risk than men.  Having obstructive sleep  apnea.  Stress. What are the signs or symptoms? Extremely high blood pressure (hypertensive crisis) may cause:  Headache.  Anxiety.  Shortness of breath.  Nosebleed.  Nausea and vomiting.  Severe chest pain.  Jerky movements you cannot control (seizures). How is this diagnosed? This condition is diagnosed by measuring your blood pressure while you are seated, with your arm resting on a surface. The cuff of the blood pressure monitor will be placed directly against the skin of your upper arm at the level of your heart. It should be measured at least twice using the same arm. Certain conditions can cause a difference in blood pressure between your right and left arms. Certain factors can cause blood pressure readings to be lower or higher than normal (elevated) for a short period of time:  When your blood pressure is higher when you are in a health care provider's office than when you are at home, this is called white coat hypertension. Most people with this condition do not need medicines.  When your blood pressure is higher at home than when you are in a health care provider's office, this is called masked hypertension.  Most people with this condition may need medicines to control blood pressure. If you have a high blood pressure reading during one visit or you have normal blood pressure with other risk factors:  You may be asked to return on a different day to have your blood pressure checked again.  You may be asked to monitor your blood pressure at home for 1 week or longer. If you are diagnosed with hypertension, you may have other blood or imaging tests to help your health care provider understand your overall risk for other conditions. How is this treated? This condition is treated by making healthy lifestyle changes, such as eating healthy foods, exercising more, and reducing your alcohol intake. Your health care provider may prescribe medicine if lifestyle changes are not  enough to get your blood pressure under control, and if:  Your systolic blood pressure is above 130.  Your diastolic blood pressure is above 80. Your personal target blood pressure may vary depending on your medical conditions, your age, and other factors. Follow these instructions at home: Eating and drinking   Eat a diet that is high in fiber and potassium, and low in sodium, added sugar, and fat. An example eating plan is called the DASH (Dietary Approaches to Stop Hypertension) diet. To eat this way:  Eat plenty of fresh fruits and vegetables. Try to fill half of your plate at each meal with fruits and vegetables.  Eat whole grains, such as whole wheat pasta, brown rice, or whole grain bread. Fill about one quarter of your plate with whole grains.  Eat or drink low-fat dairy products, such as skim milk or low-fat yogurt.  Avoid fatty cuts of meat, processed or cured meats, and poultry with skin. Fill about one quarter of your plate with lean proteins, such as fish, chicken without skin, beans, eggs, and tofu.  Avoid premade and processed foods. These tend to be higher in sodium, added sugar, and fat.  Reduce your daily sodium intake. Most people with hypertension should eat less than 1,500 mg of sodium a day.  Limit alcohol intake to no more than 1 drink a day for nonpregnant women and 2 drinks a day for men. One drink equals 12 oz of beer, 5 oz of wine, or 1 oz of hard liquor. Lifestyle   Work with your health care provider to maintain a healthy body weight or to lose weight. Ask what an ideal weight is for you.  Get at least 30 minutes of exercise that causes your heart to beat faster (aerobic exercise) most days of the week. Activities may include walking, swimming, or biking.  Include exercise to strengthen your muscles (resistance exercise), such as pilates or lifting weights, as part of your weekly exercise routine. Try to do these types of exercises for 30 minutes at least 3  days a week.  Do not use any products that contain nicotine or tobacco, such as cigarettes and e-cigarettes. If you need help quitting, ask your health care provider.  Monitor your blood pressure at home as told by your health care provider.  Keep all follow-up visits as told by your health care provider. This is important. Medicines   Take over-the-counter and prescription medicines only as told by your health care provider. Follow directions carefully. Blood pressure medicines must be taken as prescribed.  Do not skip doses of blood pressure medicine. Doing this puts you at risk for problems and can make the medicine less effective.  Ask your health care provider  about side effects or reactions to medicines that you should watch for. Contact a health care provider if:  You think you are having a reaction to a medicine you are taking.  You have headaches that keep coming back (recurring).  You feel dizzy.  You have swelling in your ankles.  You have trouble with your vision. Get help right away if:  You develop a severe headache or confusion.  You have unusual weakness or numbness.  You feel faint.  You have severe pain in your chest or abdomen.  You vomit repeatedly.  You have trouble breathing. Summary  Hypertension is when the force of blood pumping through your arteries is too strong. If this condition is not controlled, it may put you at risk for serious complications.  Your personal target blood pressure may vary depending on your medical conditions, your age, and other factors. For most people, a normal blood pressure is less than 120/80.  Hypertension is treated with lifestyle changes, medicines, or a combination of both. Lifestyle changes include weight loss, eating a healthy, low-sodium diet, exercising more, and limiting alcohol. This information is not intended to replace advice given to you by your health care provider. Make sure you discuss any questions  you have with your health care provider. Document Released: 11/22/2005 Document Revised: 10/20/2016 Document Reviewed: 10/20/2016 Elsevier Interactive Patient Education  2017 [employer].   IF you received an x-ray today, you will receive an invoice from [facility]. Please contact [facility] at [phone] with questions or concerns regarding your invoice.   IF you received labwork today, you will receive an invoice from [place]. Please contact [facility] at [phone] with questions or concerns regarding your invoice.   Our billing staff will not be able to assist you with questions regarding bills from these companies.  You will be contacted with the lab results as soon as they are available. The fastest way to get your results is to activate your My Chart account. Instructions are located on the last page of this paperwork. If you have not heard from [place] regarding the results in 2 weeks, please contact this office.    We recommend that you schedule a mammogram for breast cancer screening. Typically, you do not need a referral to do this. Please contact a local imaging center to schedule your mammogram.  [facility] - [phone]  *ask for the Radiology Department [facility]) - [phone] or [phone]  [facility] - [phone] [facility] - [phone] [facility] - [phone]  *ask for the Radiology Department [facility] - [phone]  *ask for the Radiology Department [facility] - [phone]  *ask for the Mammography Department [facility] - [phone]

## 2017-04-12 LAB — LIPID PANEL
Chol/HDL Ratio: 2.8 ratio (ref 0.0–4.4)
Cholesterol, Total: 147 mg/dL (ref 100–199)
HDL: 52 mg/dL (ref >39)
LDL Calculated: 77 mg/dL (ref 0–99)
TRIGLYCERIDES: 89 mg/dL (ref 0–149)
VLDL Cholesterol Cal: 18 mg/dL (ref 5–40)

## 2017-04-12 LAB — COMPREHENSIVE METABOLIC PANEL
A/G RATIO: 1.2 (ref 1.2–2.2)
ALT: 11 IU/L (ref 0–32)
AST: 18 IU/L (ref 0–40)
Albumin: 4.1 g/dL (ref 3.6–4.8)
Alkaline Phosphatase: 80 IU/L (ref 39–117)
BILIRUBIN TOTAL: 0.4 mg/dL (ref 0.0–1.2)
BUN/Creatinine Ratio: 17 (ref 12–28)
BUN: 12 mg/dL (ref 8–27)
CHLORIDE: 104 mmol/L (ref 96–106)
CO2: 22 mmol/L (ref 18–29)
Calcium: 9 mg/dL (ref 8.7–10.3)
Creatinine, Ser: 0.72 mg/dL (ref 0.57–1.00)
GFR calc non Af Amer: 90 mL/min/{1.73_m2} (ref >59)
GFR, EST AFRICAN AMERICAN: 104 mL/min/{1.73_m2} (ref >59)
Globulin, Total: 3.5 g/dL (ref 1.5–4.5)
Glucose: 119 mg/dL — ABNORMAL HIGH (ref 65–99)
POTASSIUM: 3.6 mmol/L (ref 3.5–5.2)
Sodium: 141 mmol/L (ref 134–144)
Total Protein: 7.6 g/dL (ref 6.0–8.5)

## 2017-04-12 LAB — HIV ANTIBODY (ROUTINE TESTING W REFLEX): HIV SCREEN 4TH GENERATION: NONREACTIVE

## 2017-04-12 LAB — HEPATITIS C ANTIBODY

## 2017-04-21 ENCOUNTER — Encounter: Payer: Self-pay | Admitting: Radiology

## 2017-06-20 ENCOUNTER — Encounter: Payer: Self-pay | Admitting: Family Medicine

## 2017-07-15 ENCOUNTER — Other Ambulatory Visit: Payer: Self-pay | Admitting: Family Medicine

## 2017-07-15 DIAGNOSIS — I10 Essential (primary) hypertension: Secondary | ICD-10-CM

## 2022-08-17 ENCOUNTER — Other Ambulatory Visit: Payer: Self-pay | Admitting: Family Medicine

## 2022-08-17 DIAGNOSIS — Z1231 Encounter for screening mammogram for malignant neoplasm of breast: Secondary | ICD-10-CM
